# Patient Record
Sex: Female | Born: 1955 | Hispanic: Yes | Marital: Married | Smoking: Never smoker
Health system: Southern US, Community
[De-identification: ages and names within clinical notes are randomized; demographics above are authoritative.]

## PROBLEM LIST (undated history)

## (undated) DIAGNOSIS — I10 Essential (primary) hypertension: Secondary | ICD-10-CM

## (undated) DIAGNOSIS — E7800 Pure hypercholesterolemia, unspecified: Secondary | ICD-10-CM

---

## 2007-10-01 ENCOUNTER — Other Ambulatory Visit: Payer: Self-pay

## 2007-10-01 ENCOUNTER — Inpatient Hospital Stay: Payer: Self-pay | Admitting: Orthopaedic Surgery

## 2010-04-22 ENCOUNTER — Ambulatory Visit: Payer: Self-pay

## 2012-06-07 ENCOUNTER — Ambulatory Visit: Payer: Self-pay | Admitting: Family Medicine

## 2013-06-10 ENCOUNTER — Ambulatory Visit: Payer: Self-pay | Admitting: Family Medicine

## 2014-06-18 ENCOUNTER — Ambulatory Visit: Payer: Self-pay | Admitting: Family Medicine

## 2016-01-21 ENCOUNTER — Other Ambulatory Visit: Payer: Self-pay | Admitting: Nurse Practitioner

## 2016-01-21 DIAGNOSIS — Z1231 Encounter for screening mammogram for malignant neoplasm of breast: Secondary | ICD-10-CM

## 2016-02-08 ENCOUNTER — Ambulatory Visit: Payer: Self-pay

## 2016-02-15 ENCOUNTER — Other Ambulatory Visit: Payer: Self-pay | Admitting: Nurse Practitioner

## 2016-02-15 ENCOUNTER — Ambulatory Visit
Admission: RE | Admit: 2016-02-15 | Discharge: 2016-02-15 | Disposition: A | Discharge status: Home / Self Care | Payer: BLUE CROSS/BLUE SHIELD | Source: Ambulatory Visit | Attending: Nurse Practitioner | Admitting: Nurse Practitioner

## 2016-02-15 DIAGNOSIS — Z1231 Encounter for screening mammogram for malignant neoplasm of breast: Secondary | ICD-10-CM | POA: Diagnosis not present

## 2017-07-19 ENCOUNTER — Encounter: Payer: Self-pay | Admitting: *Deleted

## 2017-07-19 ENCOUNTER — Encounter (INDEPENDENT_AMBULATORY_CARE_PROVIDER_SITE_OTHER): Payer: Self-pay

## 2017-07-19 ENCOUNTER — Ambulatory Visit: Payer: BLUE CROSS/BLUE SHIELD | Attending: Oncology | Admitting: *Deleted

## 2017-07-19 ENCOUNTER — Ambulatory Visit
Admission: RE | Admit: 2017-07-19 | Discharge: 2017-07-19 | Disposition: A | Discharge status: Home / Self Care | Payer: BLUE CROSS/BLUE SHIELD | Source: Ambulatory Visit | Attending: Oncology | Admitting: Oncology

## 2017-07-19 VITALS — BP 152/88 | HR 86 | Temp 97.8°F | Ht <= 58 in | Wt 109.0 lb

## 2017-07-19 DIAGNOSIS — Z0000 Encounter for general adult medical examination without abnormal findings: Secondary | ICD-10-CM

## 2017-07-19 NOTE — Progress Notes (Signed)
Subjective:     Patient ID: Paula Taylor, female   DOB: 02-09-1956, 62 y.o.   MRN: [id number]  HPI   Review of Systems     Objective:   Physical Exam  Pulmonary/Chest: Right breast exhibits no inverted nipple, no mass, no nipple discharge, no skin change and no tenderness. Left breast exhibits no inverted nipple, no mass, no nipple discharge, no skin change and no tenderness. Breasts are symmetrical.       Assessment:     62 year old Hispanic [facility]  [name], the interpreter present during the interview and exam.  Clinical breast exam unremarkable.  Taught self breast awareness.  Last pap on 06/16/17 was negative with no HPV co-testing.  Next pap due in 2021.  Blood pressure elevated at  152/88.  States she has not taken her blood pressure meds today.  She is to take them as soon as possible and recheck her blood pressure at [facility] or [facility], and if remains higher than 140/90 she is to follow-up with her primary care provider.  Hand out on hypertention given to patient.Patient has been screened for eligibility.  She does not have any insurance, Medicare or Medicaid.  She also meets financial eligibility.  Hand-out given on the Affordable Care Act.    Plan:     Screening mammogram ordered.  Will follow-up per [facility] protocol.

## 2017-07-21 ENCOUNTER — Encounter: Payer: Self-pay | Admitting: *Deleted

## 2017-07-21 NOTE — Progress Notes (Signed)
Letter mailed from the [facility] to inform patient of her normal mammogram results.  Patient is to follow-up with annual screening in one year.  HSIS to Christy. 

## 2019-01-14 ENCOUNTER — Ambulatory Visit: Payer: Self-pay

## 2019-01-29 ENCOUNTER — Ambulatory Visit: Payer: Self-pay

## 2019-02-05 ENCOUNTER — Encounter: Payer: Self-pay | Admitting: *Deleted

## 2019-02-05 ENCOUNTER — Other Ambulatory Visit: Payer: Self-pay

## 2019-02-05 ENCOUNTER — Encounter (INDEPENDENT_AMBULATORY_CARE_PROVIDER_SITE_OTHER): Payer: Self-pay

## 2019-02-05 ENCOUNTER — Ambulatory Visit: Payer: Self-pay | Attending: Oncology | Admitting: *Deleted

## 2019-02-05 ENCOUNTER — Ambulatory Visit
Admission: RE | Admit: 2019-02-05 | Discharge: 2019-02-05 | Disposition: A | Discharge status: Home / Self Care | Payer: Self-pay | Source: Ambulatory Visit | Attending: Oncology | Admitting: Oncology

## 2019-02-05 VITALS — BP 122/81 | HR 93 | Temp 97.6°F | Ht <= 58 in | Wt 117.0 lb

## 2019-02-05 DIAGNOSIS — Z0000 Encounter for general adult medical examination without abnormal findings: Secondary | ICD-10-CM

## 2019-02-05 NOTE — Progress Notes (Signed)
  Subjective:     Patient ID: Paula Taylor, female   DOB: 1956-06-03, 63 y.o.   MRN: [id number]  HPI   Review of Systems     Objective:   Physical Exam Chest:     Breasts:        Right: No swelling, bleeding, inverted nipple, mass, nipple discharge, skin change or tenderness.        Left: No swelling, bleeding, inverted nipple, mass, nipple discharge, skin change or tenderness.  Lymphadenopathy:     Upper Body:     Right upper body: No supraclavicular or axillary adenopathy.     Left upper body: No supraclavicular or axillary adenopathy.        Assessment:     63 year old Hispanic female returns to [facility] for annual screening.  Paula Taylor the interpreter present during the interview and exam.  Clinical breast exam unremarkable.  Taught self breast awareness.  Last pap on 06/16/17 was negative without HPV co-testing.  Next pap due in 2021.  Patient has been screened for eligibility.  She does not have any insurance, Medicare or Medicaid.  She also meets financial eligibility.  Hand-out given on the Affordable Care Act. Risk Assessment    Risk Scores      02/05/2019   Last edited by: Theodore Demark, RN   5-year risk: 0.9 %   Lifetime risk: 4.3 %            Plan:     Screening mammogram ordered.  Will follow up per [facility] protocol.

## 2019-02-05 NOTE — Progress Notes (Signed)
Letter mailed from the [facility] to inform patient of her normal mammogram results.  Patient is to follow-up with annual screening in one year.  HSIS to christy.

## 2020-08-12 ENCOUNTER — Emergency Department
Admission: EM | Admit: 2020-08-12 | Discharge: 2020-08-12 | Disposition: A | Discharge status: Home / Self Care | Payer: Self-pay | Attending: Student in an Organized Health Care Education/Training Program | Admitting: Student in an Organized Health Care Education/Training Program

## 2020-08-12 ENCOUNTER — Encounter: Payer: Self-pay | Admitting: Emergency Medicine

## 2020-08-12 ENCOUNTER — Other Ambulatory Visit: Payer: Self-pay

## 2020-08-12 DIAGNOSIS — Y9389 Activity, other specified: Secondary | ICD-10-CM | POA: Insufficient documentation

## 2020-08-12 DIAGNOSIS — S61211A Laceration without foreign body of left index finger without damage to nail, initial encounter: Secondary | ICD-10-CM | POA: Insufficient documentation

## 2020-08-12 DIAGNOSIS — S61209A Unspecified open wound of unspecified finger without damage to nail, initial encounter: Secondary | ICD-10-CM

## 2020-08-12 DIAGNOSIS — Y9289 Other specified places as the place of occurrence of the external cause: Secondary | ICD-10-CM | POA: Insufficient documentation

## 2020-08-12 DIAGNOSIS — S61012A Laceration without foreign body of left thumb without damage to nail, initial encounter: Secondary | ICD-10-CM

## 2020-08-12 DIAGNOSIS — Y999 Unspecified external cause status: Secondary | ICD-10-CM | POA: Insufficient documentation

## 2020-08-12 DIAGNOSIS — Z23 Encounter for immunization: Secondary | ICD-10-CM | POA: Insufficient documentation

## 2020-08-12 DIAGNOSIS — I10 Essential (primary) hypertension: Secondary | ICD-10-CM | POA: Insufficient documentation

## 2020-08-12 DIAGNOSIS — S61112A Laceration without foreign body of left thumb with damage to nail, initial encounter: Secondary | ICD-10-CM | POA: Insufficient documentation

## 2020-08-12 DIAGNOSIS — W260XXA Contact with knife, initial encounter: Secondary | ICD-10-CM | POA: Insufficient documentation

## 2020-08-12 HISTORY — DX: Pure hypercholesterolemia, unspecified: E78.00

## 2020-08-12 HISTORY — DX: Essential (primary) hypertension: I10

## 2020-08-12 MED ORDER — TRAMADOL HCL 50 MG PO TABS: 50.0000 mg | ORAL_TABLET | Freq: Four times a day (QID) | ORAL | 0 refills | Status: DC | PRN

## 2020-08-12 MED ORDER — TRAMADOL HCL 50 MG PO TABS
50.0000 mg | ORAL_TABLET | Freq: Once | ORAL | Status: AC
  Administered 2020-08-12: 50 mg via ORAL
  Filled 2020-08-12: qty 1

## 2020-08-12 MED ORDER — LIDOCAINE HCL (PF) 1 % IJ SOLN
5.0000 mL | Freq: Once | INTRAMUSCULAR | Status: AC
  Administered 2020-08-12: 5 mL via INTRADERMAL
  Filled 2020-08-12: qty 5

## 2020-08-12 MED ORDER — TETANUS-DIPHTH-ACELL PERTUSSIS 5-2.5-18.5 LF-MCG/0.5 IM SUSY
0.5000 mL | PREFILLED_SYRINGE | Freq: Once | INTRAMUSCULAR | Status: AC
  Administered 2020-08-12: 0.5 mL via INTRAMUSCULAR
  Filled 2020-08-12: qty 0.5

## 2020-08-12 NOTE — Discharge Instructions (Signed)
Follow discharge care instructions and take medication as needed for pain. 

## 2020-08-12 NOTE — ED Notes (Signed)
Pt states she was at home and cutting up food and sliced the skin off her left pointer finger and cut under the nail bed of the left thumb. Bleeding is controlled at this time

## 2020-08-12 NOTE — ED Provider Notes (Signed)
[facility] Emergency Department Provider Note   ____________________________________________   Event Date/Time   First MD Initiated Contact with Patient 08/12/20 1116     (approximate)  I have reviewed the triage vital signs and the nursing notes.   HISTORY  Chief Complaint Laceration    HPI Paula Taylor is a 65 y.o. female patient presents with skin avulsion secondary to a knife cut to the left index finger.  Patient also has small laceration to the left thumb involving the nail.  Patient denies loss sensation or loss of function of the affected digits.  Bleeding is controlled with direct pressure.  Patient rates the pain as a 3/10.  Patient described pain as "sore".  Patient tetanus shot is not up-to-date.         Past Medical History:  Diagnosis Date  . High cholesterol   . Hypertension     There are no problems to display for this patient.   History reviewed. No pertinent surgical history.  Prior to Admission medications   Medication Sig Start Date End Date Taking? Authorizing Provider  traMADol (ULTRAM) 50 MG tablet Take 1 tablet (50 mg total) by mouth every 6 (six) hours as needed for moderate pain. 08/12/20   Joni Reining, PA-C    Allergies Patient has no known allergies.  Family History  Problem Relation Age of Onset  . Breast cancer Neg Hx     Social History    Review of Systems Constitutional: No fever/chills Eyes: No visual changes. ENT: No sore throat. Cardiovascular: Denies chest pain. Respiratory: Denies shortness of breath. Gastrointestinal: No abdominal pain.  No nausea, no vomiting.  No diarrhea.  No constipation. Genitourinary: Negative for dysuria. Musculoskeletal: Negative for back pain. Skin: Negative for rash. Neurological: Negative for headaches, focal weakness or numbness. Endocrine:  Hyperlipidemia and hypertension.   ____________________________________________   PHYSICAL  EXAM:  VITAL SIGNS: ED Triage Vitals  Enc Vitals Group     BP 08/12/20 1010 (!) 149/81     Pulse Rate 08/12/20 1010 83     Resp 08/12/20 1010 20     Temp 08/12/20 1010 99.2 F (37.3 C)     Temp Source 08/12/20 1010 Oral     SpO2 08/12/20 1010 98 %     Weight 08/12/20 1030 120 lb (54.4 kg)     Height 08/12/20 1030 4\' 10"  (1.473 m)     Head Circumference --      Peak Flow --      Pain Score 08/12/20 1030 3     Pain Loc --      Pain Edu? --      Excl. in GC? --    Constitutional: Alert and oriented. Well appearing and in no acute distress. Cardiovascular: Normal rate, regular rhythm. Grossly normal heart sounds.  Good peripheral circulation. Respiratory: Normal respiratory effort.  No retractions. Lungs CTAB. Neurologic:  Normal speech and language. No gross focal neurologic deficits are appreciated. No gait instability. Skin: Skin avulsion to the dorsal aspect of the second digit left hand.  Laceration to the medial aspect of left thumb with involvement of the nail.   Psychiatric: Mood and affect are normal. Speech and behavior are normal.  ____________________________________________   LABS (all labs ordered are listed, but only abnormal results are displayed)  Labs Reviewed - No data to display ____________________________________________  EKG   ____________________________________________  RADIOLOGY I, 08/14/20, personally viewed and evaluated these images (plain radiographs) as part  of my medical decision making, as well as reviewing the written report by the radiologist.  ED MD interpretation:  Official radiology report(s): No results found.  ____________________________________________   PROCEDURES  Procedure(s) performed (including Critical Care):  Marland KitchenMarland KitchenLaceration Repair  Date/Time: 08/12/2020 12:25 PM Performed by: Joni Reining, PA-C Authorized by: Joni Reining, PA-C   Consent:    Consent obtained:  Verbal   Consent given by:  Patient    Risks, benefits, and alternatives were discussed: yes     Risks discussed:  Infection, pain, poor cosmetic result and need for additional repair Universal protocol:    Procedure explained and questions answered to patient or proxy's satisfaction: yes     Relevant documents present and verified: yes     Test results available: no     Imaging studies available: no     Required blood products, implants, devices, and special equipment available: no     Site/side marked: no     Immediately prior to procedure, a time out was called: yes     Patient identity confirmed:  Verbally with patient Anesthesia:    Anesthesia method:  Local infiltration and nerve block   Local anesthetic:  Lidocaine 1% w/o epi   Block needle gauge:  25 G   Block injection procedure:  Anatomic landmarks identified and incremental injection   Block outcome:  Anesthesia achieved Laceration details:    Location:  Finger   Finger location:  L thumb   Length (cm):  0.2   Depth (mm):  1 Pre-procedure details:    Preparation:  Patient was prepped and draped in usual sterile fashion Exploration:    Limited defect created (wound extended): no     Hemostasis achieved with:  Direct pressure   Contaminated: no   Treatment:    Area cleansed with:  Povidone-iodine and saline   Amount of cleaning:  Standard   Debridement:  None   Undermining:  None   Scar revision: no   Skin repair:    Repair method:  Tissue adhesive Approximation:    Approximation:  Close Repair type:    Repair type:  Simple Post-procedure details:    Dressing:  Bulky dressing   Procedure completion:  Tolerated well, no immediate complications     ____________________________________________   INITIAL IMPRESSION / ASSESSMENT AND PLAN / ED COURSE  As part of my medical decision making, I reviewed the following data within the electronic MEDICAL RECORD NUMBER         Patient presents a laceration to left index finger and left thumb.  See procedure  note for wound closure of left thumb.  Skin avulsion of the left index finger was treated with Surgicel and pressure dressing.  Patient given discharge care instructions and prescription for tramadol to take twice daily as needed for pain.      ____________________________________________   FINAL CLINICAL IMPRESSION(S) / ED DIAGNOSES  Final diagnoses:  Laceration of left thumb, foreign body presence unspecified, nail damage status unspecified, initial encounter  Avulsion of skin of finger without complication, initial encounter     ED Discharge Orders         Ordered    traMADol (ULTRAM) 50 MG tablet  Every 6 hours PRN,   Status:  Discontinued        08/12/20 1215    traMADol (ULTRAM) 50 MG tablet  Every 6 hours PRN        08/12/20 1216          *  Please note:  Paula Taylor was evaluated in Emergency Department on 08/12/2020 for the symptoms described in the history of present illness. She was evaluated in the context of the global COVID-19 pandemic, which necessitated consideration that the patient might be at risk for infection with the SARS-CoV-2 virus that causes COVID-19. Institutional protocols and algorithms that pertain to the evaluation of patients at risk for COVID-19 are in a state of rapid change based on information released by regulatory bodies including the CDC and federal and state organizations. These policies and algorithms were followed during the patient's care in the ED.  Some ED evaluations and interventions may be delayed as a result of limited staffing during and the pandemic.*   Note:  This document was prepared using Dragon voice recognition software and may include unintentional dictation errors.    Joni Reining, PA-C 08/12/20 1229    Willy Eddy, MD 08/12/20 [phone]

## 2020-08-12 NOTE — ED Notes (Signed)
pateint has dressing applied by PA.  Dry and intact.  Cleaned hand around the dressing.

## 2020-08-12 NOTE — ED Triage Notes (Signed)
Pt to ED via POV with laceration to L index finger. Bleeding controlled with bandage at this time.

## 2021-12-24 ENCOUNTER — Other Ambulatory Visit: Payer: Self-pay

## 2021-12-24 DIAGNOSIS — Z1231 Encounter for screening mammogram for malignant neoplasm of breast: Secondary | ICD-10-CM

## 2022-01-04 ENCOUNTER — Ambulatory Visit: Payer: Self-pay | Attending: Hematology and Oncology | Admitting: *Deleted

## 2022-01-04 ENCOUNTER — Ambulatory Visit
Admission: RE | Admit: 2022-01-04 | Discharge: 2022-01-04 | Disposition: A | Discharge status: Home / Self Care | Payer: Self-pay | Source: Ambulatory Visit | Attending: Obstetrics and Gynecology | Admitting: Obstetrics and Gynecology

## 2022-01-04 ENCOUNTER — Encounter: Payer: Self-pay | Admitting: *Deleted

## 2022-01-04 VITALS — BP 140/62 | Wt 108.2 lb

## 2022-01-04 DIAGNOSIS — Z1231 Encounter for screening mammogram for malignant neoplasm of breast: Secondary | ICD-10-CM | POA: Insufficient documentation

## 2022-01-04 DIAGNOSIS — Z01419 Encounter for gynecological examination (general) (routine) without abnormal findings: Secondary | ICD-10-CM

## 2022-01-04 NOTE — Progress Notes (Signed)
Paula Taylor is a 66 y.o. female who presents to [facility] clinic today with no complaints. Patient is here for a well woman visit.    Pap Smear: Pap not smear completed today. Last Pap smear was completed on 11/11/21 at the [place]  clinic and was normal with negative / negative results. Per patient has no history of an abnormal Pap smear. Last Pap smear result is available in Epic.   Physical exam: Breasts Breasts symmetrical. No skin abnormalities bilateral breasts. No nipple retraction bilateral breasts. No nipple discharge bilateral breasts. No lymphadenopathy. No lumps palpated bilateral breasts.       Pelvic/Bimanual Pap is not indicated today    Smoking History: Patient has never smoked    Patient Navigation: Patient education provided. Access to services provided for patient through [facility] program. [place], the [facility] interpreter provided interpretation. No transportation provided   Colorectal Cancer Screening: Patient has not had a colonoscopy, but did have a negative FIT test on 11/19/21.  No complaints today.    Breast and Cervical Cancer Risk Assessment: Patient does not have family history of breast cancer, known genetic mutations, or radiation treatment to the chest before age 42. Patient does not have history of cervical dysplasia, immunocompromised, or DES exposure in-utero. Risk Assessment     Risk Scores       01/04/2022 02/05/2019   Last edited by: Narda Rutherford, LPN Scarlett Presto, RN   5-year risk: 1 % 0.9 %   Lifetime risk: 3.9 % 4.3 %             Risk Assessment   No risk assessment data for the current encounter  Risk Scores       02/05/2019   Last edited by: Scarlett Presto, RN   5-year risk: 0.9 %   Lifetime risk: 4.3 %            A: [facility] exam without pap smear   P: Referred patient to the [facility] for a screening mammogram. Appointment scheduled today.  Jim Like, RN 01/04/2022 9:19 AM

## 2022-11-22 IMAGING — MG MM DIGITAL SCREENING BILAT W/ TOMO AND CAD
6 of 10 series · 6 of 30 positions shown · non-contrast
Comparison: Previous exam(s).

CLINICAL DATA: Screening.

EXAM:
DIGITAL SCREENING BILATERAL MAMMOGRAM WITH TOMOSYNTHESIS AND CAD
TECHNIQUE: Bilateral screening digital craniocaudal and mediolateral oblique
mammograms were obtained. Bilateral screening digital breast
tomosynthesis was performed. The images were evaluated with
computer-aided detection.

[L CC synth-2D]
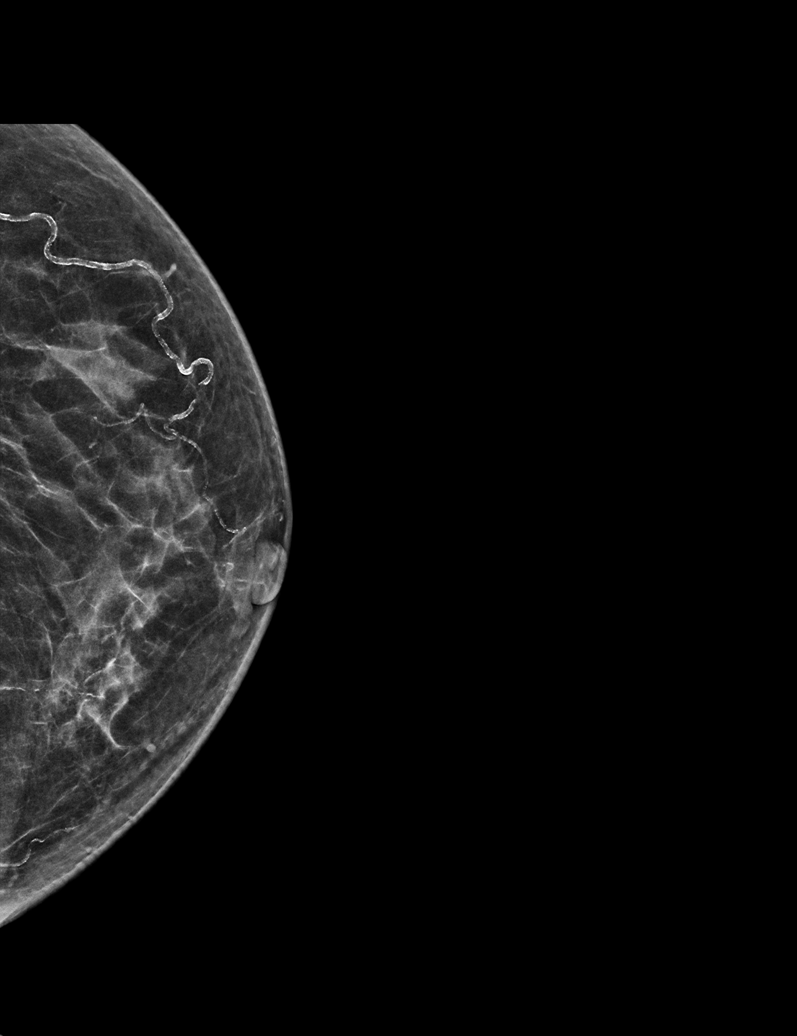

[R CC synth-2D (1 of 2)]
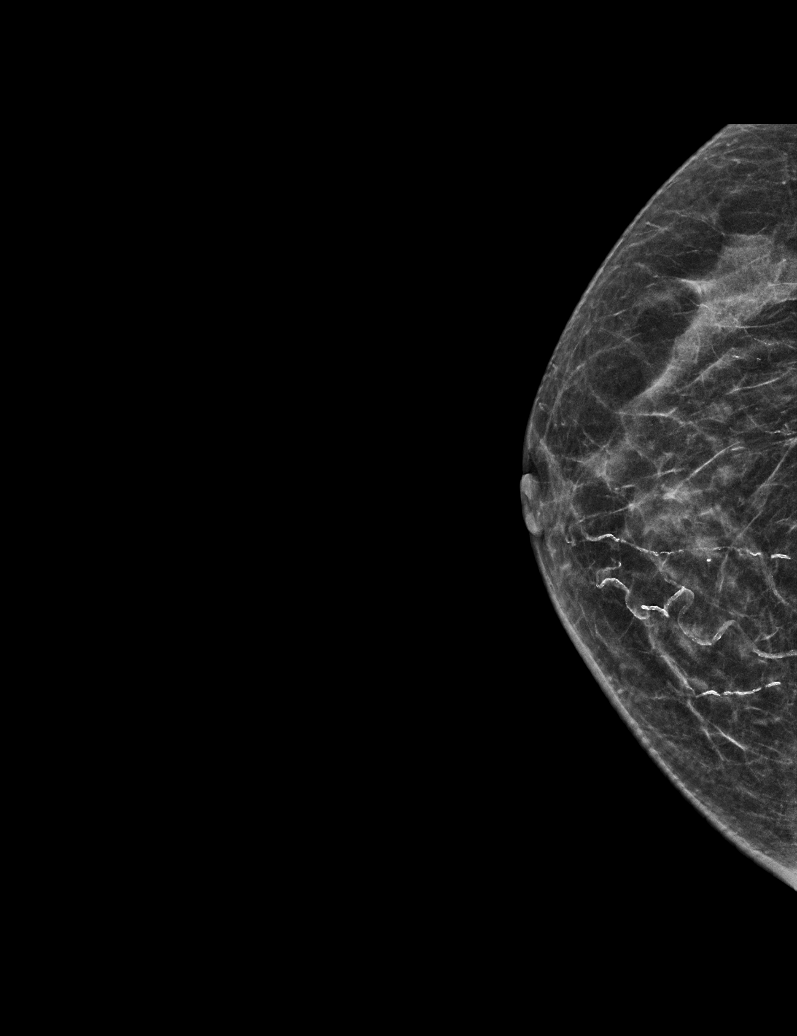

[R CC synth-2D (2 of 2)]
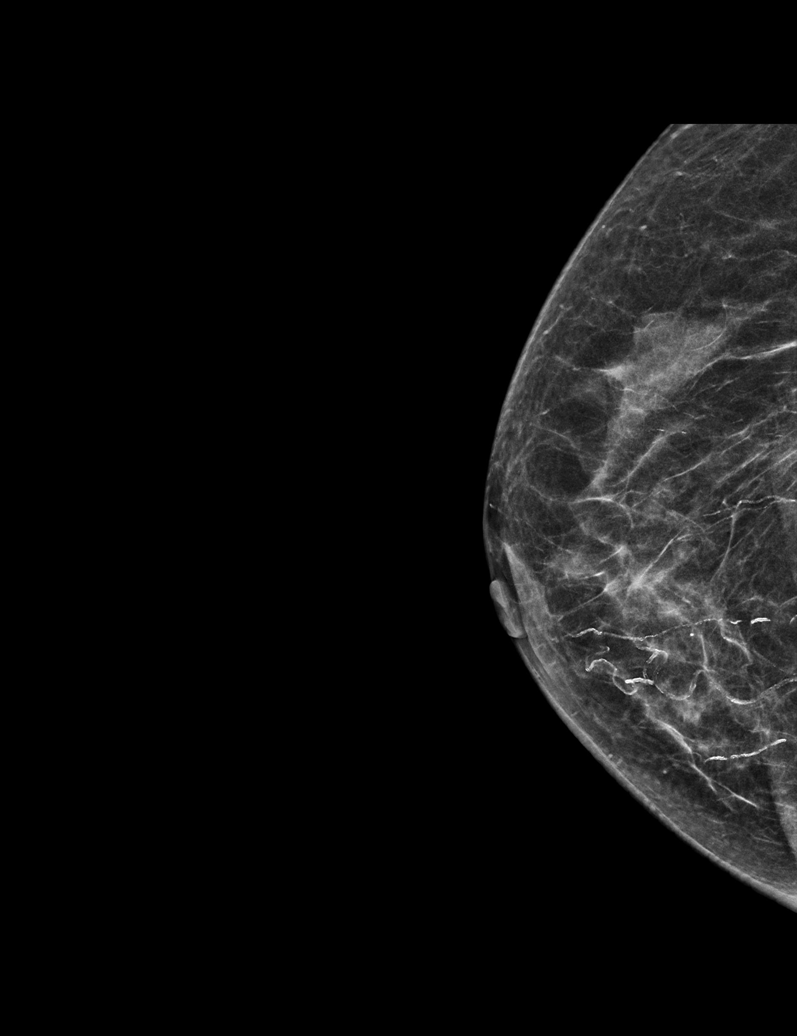

[L MLO synth-2D]
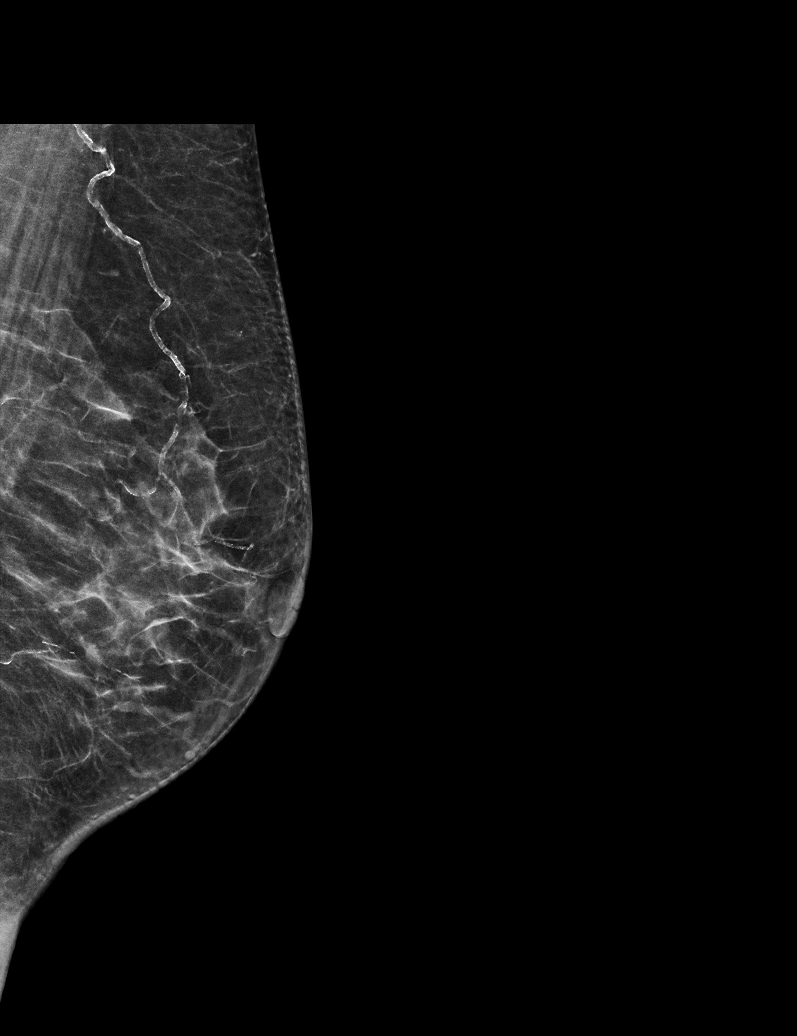

[R MLO synth-2D]
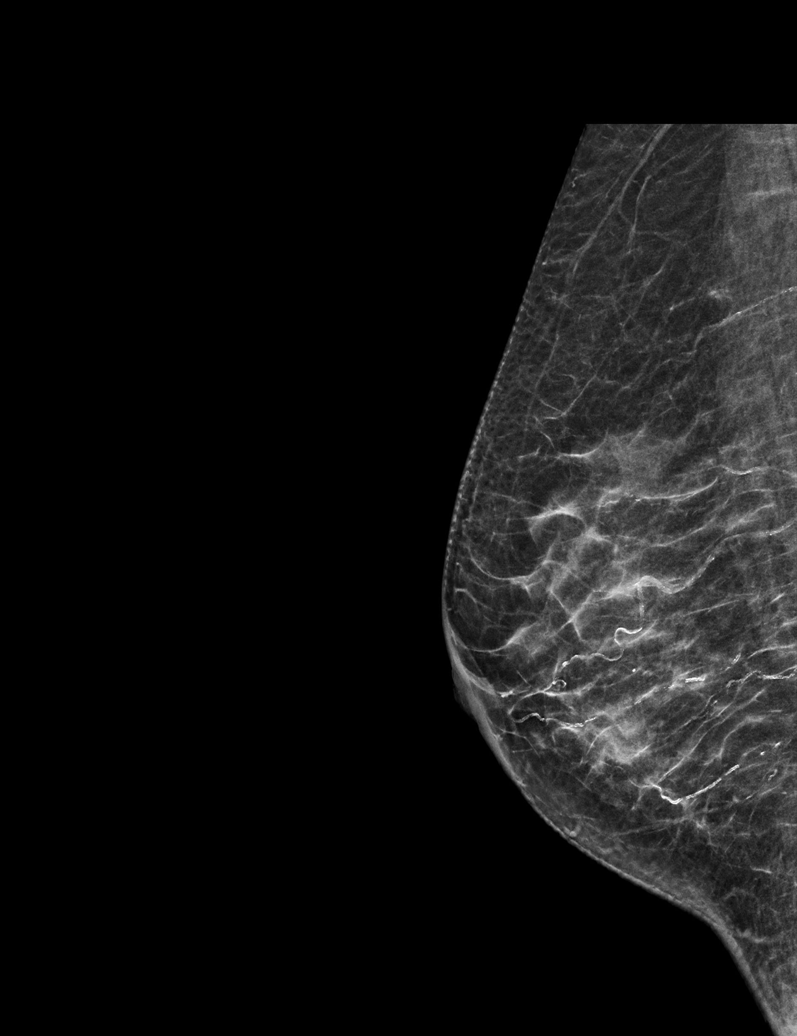

[L MLO tomo · tomo slice 27/52.0]
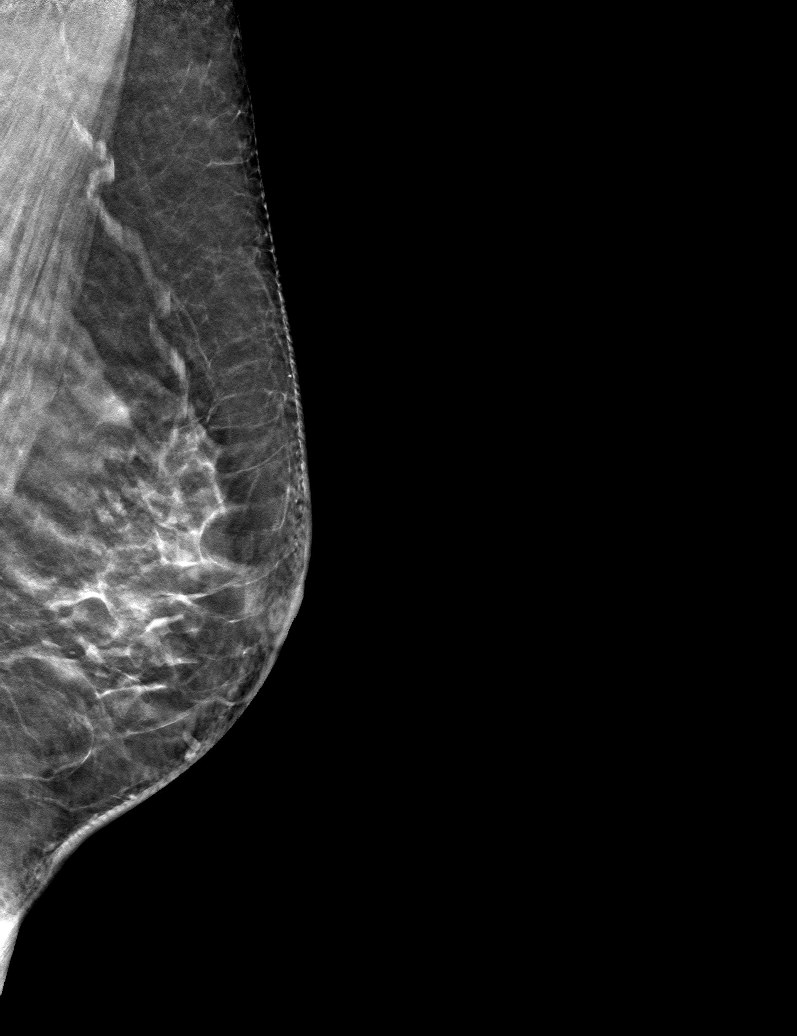

[6 of 30 positions shown; findings below may reference images not displayed]

ACR Breast Density Category c: The breast tissue is heterogeneously
dense, which may obscure small masses.
FINDINGS: There are no findings suspicious for malignancy.
IMPRESSION: No mammographic evidence of malignancy. A result letter of this
screening mammogram will be mailed directly to the patient.

RECOMMENDATION:
Screening mammogram in one year. (Code:Q3-W-BC3)

BI-RADS CATEGORY  1: Negative.

## 2022-12-01 ENCOUNTER — Encounter: Payer: Self-pay | Admitting: Family Medicine

## 2022-12-08 ENCOUNTER — Other Ambulatory Visit: Payer: Self-pay

## 2022-12-08 DIAGNOSIS — Z1231 Encounter for screening mammogram for malignant neoplasm of breast: Secondary | ICD-10-CM

## 2023-02-20 ENCOUNTER — Ambulatory Visit: Payer: Self-pay | Attending: Hematology and Oncology | Admitting: Hematology and Oncology

## 2023-02-20 ENCOUNTER — Ambulatory Visit
Admission: RE | Admit: 2023-02-20 | Discharge: 2023-02-20 | Disposition: A | Discharge status: Home / Self Care | Payer: Self-pay | Source: Ambulatory Visit | Attending: Obstetrics and Gynecology | Admitting: Obstetrics and Gynecology

## 2023-02-20 VITALS — BP 149/75 | Wt 108.0 lb

## 2023-02-20 DIAGNOSIS — Z1231 Encounter for screening mammogram for malignant neoplasm of breast: Secondary | ICD-10-CM

## 2023-02-20 NOTE — Patient Instructions (Signed)
Taught Paula Taylor about self breast awareness and gave educational materials to take home. Patient did not need a Pap smear today due to last Pap smear was in 11/11/2021 per patient. Let her know [facility] will cover Pap smears every 5 years unless has a history of abnormal Pap smears. Referred patient to the [facility] for screening mammogram. Appointment scheduled for 02/20/23. Patient aware of appointment and will be there. Let patient know will follow up with her within the next couple weeks with results. Paula Taylor verbalized understanding.  Pascal Lux, NP 9:37 AM

## 2023-02-20 NOTE — Progress Notes (Signed)
Paula Taylor is a 67 y.o. female who presents to [facility] clinic today with no complaints.    Pap Smear: Pap not smear completed today. Last Pap smear was 11/11/2021 at [facility] and was normal. Per patient has no history of an abnormal Pap smear. Last Pap smear result is available in Epic.   Physical exam: Breasts Breasts symmetrical. No skin abnormalities bilateral breasts. No nipple retraction bilateral breasts. No nipple discharge bilateral breasts. No lymphadenopathy. No lumps palpated bilateral breasts. MS DIGITAL SCREENING TOMO BILATERAL  Result Date: 01/05/2022 CLINICAL DATA:  Screening. EXAM: DIGITAL SCREENING BILATERAL MAMMOGRAM WITH TOMOSYNTHESIS AND CAD TECHNIQUE: Bilateral screening digital craniocaudal and mediolateral oblique mammograms were obtained. Bilateral screening digital breast tomosynthesis was performed. The images were evaluated with computer-aided detection. COMPARISON:  Previous exam(s). ACR Breast Density Category c: The breast tissue is heterogeneously dense, which may obscure small masses. FINDINGS: There are no findings suspicious for malignancy. IMPRESSION: No mammographic evidence of malignancy. A result letter of this screening mammogram will be mailed directly to the patient. RECOMMENDATION: Screening mammogram in one year. (Code:SM-B-01Y) BI-RADS CATEGORY  1: Negative. Electronically Signed   By: Bary Richard M.D.   On: 01/05/2022 13:04   MS DIGITAL SCREENING TOMO BILATERAL  Result Date: 02/05/2019 CLINICAL DATA:  Screening. EXAM: DIGITAL SCREENING BILATERAL MAMMOGRAM WITH TOMO AND CAD COMPARISON:  Previous exam(s). ACR Breast Density Category c: The breast tissue is heterogeneously dense, which may obscure small masses. FINDINGS: There are no findings suspicious for malignancy. Images were processed with CAD. IMPRESSION: No mammographic evidence of malignancy. A result letter of this screening mammogram will be mailed directly to the  patient. RECOMMENDATION: Screening mammogram in one year. (Code:SM-B-01Y) BI-RADS CATEGORY  1: Negative. Electronically Signed   By: Ted Mcalpine M.D.   On: 02/05/2019 14:43          Pelvic/Bimanual Pap is not indicated today    Smoking History: Patient has never smoked and was not referred to quit line.    Patient Navigation: Patient education provided. Access to services provided for patient through [facility] program. Rito Ehrlich interpreter provided. No transportation provided   Colorectal Cancer Screening: Per patient has never had colonoscopy completed No complaints today.    Breast and Cervical Cancer Risk Assessment: Patient does not have family history of breast cancer, known genetic mutations, or radiation treatment to the chest before age 41. Patient does not have history of cervical dysplasia, immunocompromised, or DES exposure in-utero.  Risk Assessment   No risk assessment data for the current encounter  Risk Scores       01/04/2022   Last edited by: Narda Rutherford, LPN   5-year risk: 1%   Lifetime risk: 3.9%            A: [facility] exam without pap smear No complaints with benign exam.   P: Referred patient to the [facility] for a screening mammogram. Appointment scheduled 02/20/23.  Pascal Lux, NP 02/20/2023 9:27 AM

## 2024-01-24 ENCOUNTER — Other Ambulatory Visit: Payer: Self-pay | Admitting: Family Medicine

## 2024-01-24 DIAGNOSIS — Z1231 Encounter for screening mammogram for malignant neoplasm of breast: Secondary | ICD-10-CM

## 2024-02-23 ENCOUNTER — Ambulatory Visit
Admission: RE | Admit: 2024-02-23 | Discharge: 2024-02-23 | Disposition: A | Discharge status: Home / Self Care | Payer: Self-pay | Source: Ambulatory Visit | Attending: Family Medicine | Admitting: Family Medicine

## 2024-02-23 DIAGNOSIS — Z1231 Encounter for screening mammogram for malignant neoplasm of breast: Secondary | ICD-10-CM | POA: Diagnosis present
